# Patient Record
Sex: Female | Born: 1990 | Race: Asian | Hispanic: No | Marital: Single | Smoking: Never smoker
Health system: Southern US, Community
[De-identification: ages and names within clinical notes are randomized; demographics above are authoritative.]

## PROBLEM LIST (undated history)

## (undated) DIAGNOSIS — M549 Dorsalgia, unspecified: Secondary | ICD-10-CM

## (undated) DIAGNOSIS — G8929 Other chronic pain: Secondary | ICD-10-CM

## (undated) DIAGNOSIS — N946 Dysmenorrhea, unspecified: Secondary | ICD-10-CM

## (undated) DIAGNOSIS — J309 Allergic rhinitis, unspecified: Secondary | ICD-10-CM

---
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
## 2014-12-15 ENCOUNTER — Ambulatory Visit
Admission: EM | Admit: 2014-12-15 | Discharge: 2014-12-15 | Disposition: A | Discharge status: Home / Self Care | Payer: Managed Care, Other (non HMO) | Attending: Family Medicine | Admitting: Family Medicine

## 2014-12-15 DIAGNOSIS — B950 Streptococcus, group A, as the cause of diseases classified elsewhere: Secondary | ICD-10-CM | POA: Insufficient documentation

## 2014-12-15 DIAGNOSIS — J020 Streptococcal pharyngitis: Secondary | ICD-10-CM | POA: Insufficient documentation

## 2014-12-15 DIAGNOSIS — J029 Acute pharyngitis, unspecified: Secondary | ICD-10-CM | POA: Diagnosis present

## 2014-12-15 DIAGNOSIS — R51 Headache: Secondary | ICD-10-CM | POA: Diagnosis present

## 2014-12-15 LAB — RAPID STREP SCREEN (MED CTR MEBANE ONLY): Streptococcus, Group A Screen (Direct): POSITIVE — AB

## 2014-12-15 MED ORDER — KETOROLAC TROMETHAMINE 60 MG/2ML IM SOLN
60.0000 mg | Freq: Once | INTRAMUSCULAR | Status: AC
  Administered 2014-12-15: 60 mg via INTRAMUSCULAR

## 2014-12-15 MED ORDER — PENICILLIN G BENZATHINE 1200000 UNIT/2ML IM SUSP
1.2000 10*6.[IU] | Freq: Once | INTRAMUSCULAR | Status: AC
  Administered 2014-12-15: 1.2 10*6.[IU] via INTRAMUSCULAR

## 2014-12-15 NOTE — ED Notes (Signed)
Not feeling well the past day or so, sore throat and headache. Feeling achy.

## 2014-12-15 NOTE — ED Provider Notes (Signed)
CSN: [id number]     Arrival date & time 12/15/14  [id number] History   First MD Initiated Contact with Patient 12/15/14 1003     Chief Complaint  Patient presents with  . Sore Throat  . Headache   (Consider location/radiation/quality/duration/timing/severity/associated sxs/prior Treatment) HPI 24 yo F with 24 hours of sore throat ,fatigue, ,malaise. Felt bad yesterday but worked all day--feels worse today. Depo-provera on schedule  History reviewed. No pertinent past medical history. History reviewed. No pertinent past surgical history. History reviewed. No pertinent family history. History  Substance Use Topics  . Smoking status: Never Smoker   . Smokeless tobacco: Never Used  . Alcohol Use: Yes   OB History    No data available     Review of Systems Review of 10 systems negative for acute change except as referenced in HPI  Allergies  Review of patient's allergies indicates no known allergies.  Home Medications   Prior to Admission medications   Medication Sig Start Date End Date Taking? Authorizing Provider  medroxyPROGESTERone (DEPO-PROVERA) 150 MG/ML injection Inject 150 mg into the muscle every 3 (three) months.   Yes Historical Provider, MD   BP 115/62 mmHg  Pulse 75  Temp(Src) 98.1 F (36.7 C) (Tympanic)  Resp 20  Ht 5\' 3"  (1.6 m)  Wt 160 lb (72.576 kg)  BMI 28.35 kg/m2  SpO2 100% Physical Exam Constitutional -alert and oriented,tired appearing Head-atraumatic Eyes- conjunctiva normal, EOMI ,conjugate gaze Nose- no congestion or rhinorrhea Mouth/throat- mucous membranes moist ,oropharynx non-erythematous- tonsils enlarged, no exudate; voice deeper than normal Neck- supple with mild anterior cervical glandular enlargement and tenderness CV- regular rate, grossly normal heart sounds,  Resp-no distress, normal respiratory effort,clear to auscultation bilaterally GI- soft,non-tender,no distention GU-not examined MSK- no lower extremity tenderness nor edema,no joint  effusion, ambulatory Neuro- normal speech and language  Skin-warm,dry ,intact; no rash noted Psych-mood and affect grossly normal; speech and behavior grossly normal  ED Course  Procedures (including critical care time) Labs Review Labs Reviewed  RAPID STREP SCREEN (NOT AT [facility]) - Abnormal; Notable for the following:    Streptococcus, Group A Screen (Direct) POSITIVE (*)    All other components within normal limits    Imaging Review No results found.  Medications  penicillin g benzathine (BICILLIN LA) 1200000 UNIT/2ML injection 1.2 Million Units (1.2 Million Units Intramuscular Given 12/15/14 1023)  ketorolac (TORADOL) injection 60 mg (60 mg Intramuscular Given 12/15/14 1023)   Well tolerated by patient. MDM   1. Strep pharyngitis     Plan: 1. Test results and diagnosis reviewed with patient 2. Rx accompolished  3. Recommend supportive treatment with tylenol/ibuprofen; gargles 4. F/u prn if symptoms worsen or don't improve- may return to work tomorrow if afebrile   Rae HalstedLaurie W Deontray Hunnicutt, PA-C 12/18/14 1426

## 2015-07-31 ENCOUNTER — Ambulatory Visit
Admission: EM | Admit: 2015-07-31 | Discharge: 2015-07-31 | Disposition: A | Discharge status: Home / Self Care | Payer: Managed Care, Other (non HMO) | Attending: Family Medicine | Admitting: Family Medicine

## 2015-07-31 ENCOUNTER — Encounter: Payer: Self-pay | Admitting: Gynecology

## 2015-07-31 DIAGNOSIS — J988 Other specified respiratory disorders: Principal | ICD-10-CM

## 2015-07-31 DIAGNOSIS — B349 Viral infection, unspecified: Secondary | ICD-10-CM | POA: Diagnosis not present

## 2015-07-31 DIAGNOSIS — B9789 Other viral agents as the cause of diseases classified elsewhere: Secondary | ICD-10-CM

## 2015-07-31 HISTORY — DX: Allergic rhinitis, unspecified: J30.9

## 2015-07-31 HISTORY — DX: Dysmenorrhea, unspecified: N94.6

## 2015-07-31 HISTORY — DX: Other chronic pain: G89.29

## 2015-07-31 HISTORY — DX: Dorsalgia, unspecified: M54.9

## 2015-07-31 LAB — RAPID INFLUENZA A&B ANTIGENS
Influenza A (ARMC): NOT DETECTED
Influenza B (ARMC): NOT DETECTED

## 2015-07-31 LAB — RAPID STREP SCREEN (MED CTR MEBANE ONLY): Streptococcus, Group A Screen (Direct): NEGATIVE

## 2015-07-31 MED ORDER — OSELTAMIVIR PHOSPHATE 75 MG PO CAPS: 75.0000 mg | ORAL_CAPSULE | Freq: Two times a day (BID) | ORAL | Status: DC

## 2015-07-31 NOTE — ED Provider Notes (Signed)
[facility]  ____________________________________________  Time seen: Approximately 8:59 PM  I have reviewed the triage vital signs and the nursing notes.   HISTORY  Chief Complaint Sore Throat  HPI Casey Turner is a 25 y.o. female presents for the complaint of one day of runny nose, sore throat, cough. Reports also accompanying body aches. Denies known fever. Reports has continued to eat and drink well. 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 Reports is taken over-the-counter Sudafed with minimal improvement.  Denies chest pain, shortness of breath, abdominal pain, dizziness, weakness, dysuria, rash, neck or back pain.   Past Medical History  Diagnosis Date  . Chronic back pain   . Dysmenorrhea   . Allergic rhinitis    denies chance of pregnancy  There are no active problems to display for this patient.   History reviewed. No pertinent past surgical history.  Current Outpatient Rx  Name  Route  Sig  Dispense  Refill  . medroxyPROGESTERone (DEPO-PROVERA) 150 MG/ML injection   Intramuscular   Inject 150 mg into the muscle every 3 (three) months.          Allergies Review of patient's allergies indicates no known allergies.  No family history on file.  Social History Social History  Substance Use Topics  . Smoking status: Never Smoker   . Smokeless tobacco: Never Used  . Alcohol Use: Yes    Review of Systems Constitutional: No fever/chills Eyes: No visual changes. ENT: Positive runny nose, nasal congestion, sore throat and cough. Cardiovascular: Denies chest pain. Respiratory: Denies shortness of breath. Gastrointestinal: No abdominal pain.  No nausea, no vomiting.  No diarrhea.  No constipation. Genitourinary: Negative for dysuria. Musculoskeletal: Negative for back pain. Skin: Negative for rash. Neurological: Negative for headaches, focal weakness or  numbness.  10-point ROS otherwise negative.  ____________________________________________   PHYSICAL EXAM:  VITAL SIGNS: ED Triage Vitals  Enc Vitals Group     BP 07/31/15 1920 109/63 mmHg     Pulse Rate 07/31/15 1920 73     Resp 07/31/15 1920 16     Temp 07/31/15 1920 98.7 F (37.1 C)     Temp Source 07/31/15 1920 Oral     SpO2 07/31/15 1920 100 %     Weight 07/31/15 1920 174 lb (78.926 kg)     Height 07/31/15 1920  (1.6 m)     Head Cir --      Peak Flow --      Pain Score 07/31/15 1923 5     Pain Loc --      Pain Edu? --      Excl. in GC? --     Constitutional: Alert and oriented. Well appearing and in no acute distress. Eyes: Conjunctivae are normal. PERRL. EOMI. Head: Atraumatic. No sinus tenderness to palpation. No swelling. No erythema.  Ears: no erythema, normal TMs bilaterally.   Nose: Nasal congestion with clear rhinorrhea.  Mouth/Throat: Mucous membranes are moist.  Mild pharyngeal erythema. No tonsillar swelling or exudate. Neck: No stridor.  No cervical spine tenderness to palpation. Hematological/Lymphatic/Immunilogical: No cervical lymphadenopathy. Cardiovascular: Normal rate, regular rhythm. Grossly normal heart sounds.  Good peripheral circulation. Respiratory: Normal respiratory effort.  No retractions. Lungs CTAB. No wheezes, rales or rhonchi. Gastrointestinal: Soft and nontender. No distention. Normal Bowel sounds.  Musculoskeletal: No lower or upper extremity tenderness nor edema.  Neurologic:  Normal speech and language. No gross  focal neurologic deficits are appreciated. No gait instability. Skin:  Skin is warm, dry and intact. No rash noted. Psychiatric: Mood and affect are normal. Speech and behavior are normal.  ____________________________________________   LABS (all labs ordered are listed, but only abnormal results are displayed)  Labs Reviewed  RAPID STREP SCREEN (NOT AT [facility])  RAPID INFLUENZA A&B ANTIGENS ([facility] ONLY)  CULTURE,  GROUP A STREP [facility])     INITIAL IMPRESSION / ASSESSMENT AND PLAN / ED COURSE  Pertinent labs & imaging results that were available during my care of the patient were reviewed by me and considered in my medical decision making (see chart for details).  Very well-appearing patient. No acute distress. Presents for the complaints of 1 day history of runny nose, nasal congestion, sore throat and cough. Denies known fevers. Positive exposure to the flu just prior to symptom onset. Quick strep negative, will culture. Influenza test negative. However suspect viral infection such as influenza and with positive exposure just prior to symptom onset will treat with oral Tamiflu. Will also treat with oral Claritin-D over-the-counter as needed. Encouraged rest, fluids, over-the-counter Tylenol or ibuprofen as needed. School note given for tomorrow. Encourage PCP follow up as needed.  Discussed follow up with Primary care physician this week. Discussed follow up and return parameters including no resolution or any worsening concerns. Patient verbalized understanding and agreed to plan.   ____________________________________________   FINAL CLINICAL IMPRESSION(S) / ED DIAGNOSES  Final diagnoses:  Viral respiratory illness      Note: This dictation was prepared with Dragon dictation along with smaller phrase technology. Any transcriptional errors that result from this process are unintentional.    [name], NP 07/31/15 2112

## 2015-07-31 NOTE — ED Notes (Signed)
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

## 2015-07-31 NOTE — Discharge Instructions (Signed)
Take medication as prescribed. Rest. Drink plenty of fluids.  ° °Follow up with your primary care physician this week as needed. Return to Urgent care for new or worsening concerns.  ° ° °Viral Infections °A viral infection can be caused by different types of viruses. Most viral infections are not serious and resolve on their own. However, some infections may cause severe symptoms and may lead to further complications. °SYMPTOMS °Viruses can frequently cause: °· Minor sore throat. °· Aches and pains. °· Headaches. °· Runny nose. °· Different types of rashes. °· Watery eyes. °· Tiredness. °· Cough. °· Loss of appetite. °· Gastrointestinal infections, resulting in nausea, vomiting, and diarrhea. °These symptoms do not respond to antibiotics because the infection is not caused by bacteria. However, you might catch a bacterial infection following the viral infection. This is sometimes called a "superinfection." Symptoms of such a bacterial infection may include: °· Worsening sore throat with pus and difficulty swallowing. °· Swollen neck glands. °· Chills and a high or persistent fever. °· Severe headache. °· Tenderness over the sinuses. °· Persistent overall ill feeling (malaise), muscle aches, and tiredness (fatigue). °· Persistent cough. °· Yellow, green, or brown mucus production with coughing. °HOME CARE INSTRUCTIONS  °· Only take over-the-counter or prescription medicines for pain, discomfort, diarrhea, or fever as directed by your caregiver. °· Drink enough water and fluids to keep your urine clear or pale yellow. Sports drinks can provide valuable electrolytes, sugars, and hydration. °· Get plenty of rest and maintain proper nutrition. Soups and broths with crackers or rice are fine. °SEEK IMMEDIATE MEDICAL CARE IF:  °· You have severe headaches, shortness of breath, chest pain, neck pain, or an unusual rash. °· You have uncontrolled vomiting, diarrhea, or you are unable to keep down fluids. °· You or your child  has an oral temperature above 102° F (38.9° C), not controlled by medicine. °· Your baby is older than 3 months with a rectal temperature of 102° F (38.9° C) or higher. °· Your baby is 3 months old or younger with a rectal temperature of 100.4° F (38° C) or higher. °MAKE SURE YOU:  °· Understand these instructions. °· Will watch your condition. °· Will get help right away if you are not doing well or get worse. °  °This information is not intended to replace advice given to you by your health care provider. Make sure you discuss any questions you have with your health care provider. °  °Document Released: 03/10/2005 Document Revised: 08/23/2011 Document Reviewed: 11/06/2014 °Elsevier Interactive Patient Education ©2016 [employer] Inc[employer] ° °

## 2015-08-02 LAB — CULTURE, GROUP A STREP (THRC)

## 2018-01-13 ENCOUNTER — Other Ambulatory Visit: Payer: Self-pay

## 2018-01-13 ENCOUNTER — Encounter: Payer: Self-pay | Admitting: Emergency Medicine

## 2018-01-13 ENCOUNTER — Ambulatory Visit
Admission: EM | Admit: 2018-01-13 | Discharge: 2018-01-13 | Disposition: A | Discharge status: Home / Self Care | Payer: Commercial Managed Care - PPO | Attending: Family Medicine | Admitting: Family Medicine

## 2018-01-13 DIAGNOSIS — L501 Idiopathic urticaria: Secondary | ICD-10-CM

## 2018-01-13 NOTE — ED Provider Notes (Signed)
[facility]    CSN: [id number] Arrival date & time: 01/13/18  0813     History   Chief Complaint Chief Complaint  Patient presents with  . Urticaria    HPI Casey Turner is a 27 y.o. female.   27 yo female with a c/o hives since yesterday. States she took 2 benadryl last night. Denies any shortness of breath, swelling, wheezing, chest pains, fevers, chills.  No known trigger. Denies any recent illness, new medications, new soaps or detergents.   The history is provided by the patient.    Past Medical History:  Diagnosis Date  . Allergic rhinitis   . Chronic back pain   . Dysmenorrhea     There are no active problems to display for this patient.   History reviewed. No pertinent surgical history.  OB History   None      Home Medications    Prior to Admission medications   Medication Sig Start Date End Date Taking? Authorizing Provider  medroxyPROGESTERone (DEPO-PROVERA) 150 MG/ML injection Inject 150 mg into the muscle every 3 (three) months.   Yes [provider]  oseltamivir (TAMIFLU) 75 MG capsule Take 1 capsule (75 mg total) by mouth every 12 (twelve) hours. 07/31/15   Renford DillsMiller, Lindsey, NP    Family History Family History  Problem Relation Age of Onset  . Healthy Mother   . Healthy Father     Social History Social History   Tobacco Use  . Smoking status: Never Smoker  . Smokeless tobacco: Never Used  Substance Use Topics  . Alcohol use: Yes  . Drug use: No     Allergies   Patient has no known allergies.   Review of Systems Review of Systems   Physical Exam Triage Vital Signs ED Triage Vitals  Enc Vitals Group     BP 01/13/18 0826 120/78     Pulse Rate 01/13/18 0826 74     Resp 01/13/18 0826 16     Temp 01/13/18 0826 98.6 F (37 C)     Temp Source 01/13/18 0826 Oral     SpO2 01/13/18 0826 100 %     Weight 01/13/18 0824 187 lb (84.8 kg)     Height 01/13/18 0824 5\' 3"  (1.6 m)     Head Circumference --    Peak Flow --      Pain Score 01/13/18 0824 0     Pain Loc --      Pain Edu? --      Excl. in GC? --    No data found.  Updated Vital Signs BP 120/78 (BP Location: Left Arm)   Pulse 74   Temp 98.6 F (37 C) (Oral)   Resp 16   Ht 5\' 3"  (1.6 m)   Wt 187 lb (84.8 kg)   SpO2 100%   BMI 33.13 kg/m   Visual Acuity Right Eye Distance:   Left Eye Distance:   Bilateral Distance:    Right Eye Near:   Left Eye Near:    Bilateral Near:     Physical Exam  Constitutional: She appears well-developed and well-nourished. No distress.  HENT:  Mouth/Throat: Uvula is midline. No uvula swelling. No oropharyngeal exudate, posterior oropharyngeal edema, posterior oropharyngeal erythema or tonsillar abscesses. No tonsillar exudate.  Cardiovascular: Normal rate, regular rhythm and normal heart sounds.  Pulmonary/Chest: Effort normal and breath sounds normal. No stridor. No respiratory distress. She has no wheezes. She has no rales.  Skin: Rash noted. Rash is  urticarial (diffuse over extremities and trunk). She is not diaphoretic.  Nursing note and vitals reviewed.    UC Treatments / Results  Labs (all labs ordered are listed, but only abnormal results are displayed) Labs Reviewed - No data to display  EKG None  Radiology No results found.  Procedures Procedures (including critical care time)  Medications Ordered in UC Medications - No data to display  Initial Impression / Assessment and Plan / UC Course  I have reviewed the triage vital signs and the nursing notes.  Pertinent labs & imaging results that were available during my care of the patient were reviewed by me and considered in my medical decision making (see chart for details).      Final Clinical Impressions(s) / UC Diagnoses   Final diagnoses:  Idiopathic urticaria     Discharge Instructions     Zyrtec, claritin or allegra once daily Zantac twice  Benadryl as needed    ED Prescriptions    None      1. diagnosis reviewed with patient 2. Recommend supportive treatment as above 3. Follow-up prn if symptoms worsen or don't improve   Controlled Substance Prescriptions [place] Controlled Substance Registry consulted? Not Applicable   Payton Mccallum, MD 01/13/18 [phone]

## 2018-01-13 NOTE — ED Triage Notes (Signed)
Patient c/o hives that started last night. Stated she took 2 Benadryl with no relief.

## 2018-01-13 NOTE — Discharge Instructions (Addendum)
Zyrtec, claritin or allegra once daily Zantac twice  Benadryl as needed

## 2018-05-14 ENCOUNTER — Ambulatory Visit (INDEPENDENT_AMBULATORY_CARE_PROVIDER_SITE_OTHER): Payer: Commercial Managed Care - PPO

## 2018-05-14 ENCOUNTER — Encounter: Payer: Self-pay | Admitting: Gynecology

## 2018-05-14 ENCOUNTER — Ambulatory Visit
Admission: EM | Admit: 2018-05-14 | Discharge: 2018-05-14 | Disposition: A | Discharge status: Home / Self Care | Payer: Commercial Managed Care - PPO | Attending: Emergency Medicine | Admitting: Emergency Medicine

## 2018-05-14 ENCOUNTER — Other Ambulatory Visit: Payer: Self-pay

## 2018-05-14 DIAGNOSIS — J329 Chronic sinusitis, unspecified: Secondary | ICD-10-CM | POA: Insufficient documentation

## 2018-05-14 DIAGNOSIS — R079 Chest pain, unspecified: Secondary | ICD-10-CM

## 2018-05-14 DIAGNOSIS — G8929 Other chronic pain: Secondary | ICD-10-CM | POA: Insufficient documentation

## 2018-05-14 DIAGNOSIS — R059 Cough, unspecified: Secondary | ICD-10-CM

## 2018-05-14 DIAGNOSIS — R0789 Other chest pain: Secondary | ICD-10-CM | POA: Diagnosis not present

## 2018-05-14 DIAGNOSIS — R0982 Postnasal drip: Secondary | ICD-10-CM | POA: Insufficient documentation

## 2018-05-14 DIAGNOSIS — Z793 Long term (current) use of hormonal contraceptives: Secondary | ICD-10-CM | POA: Insufficient documentation

## 2018-05-14 DIAGNOSIS — R05 Cough: Secondary | ICD-10-CM

## 2018-05-14 DIAGNOSIS — J019 Acute sinusitis, unspecified: Secondary | ICD-10-CM | POA: Diagnosis not present

## 2018-05-14 LAB — PREGNANCY, URINE: Preg Test, Ur: NEGATIVE

## 2018-05-14 LAB — TROPONIN I: Troponin I: <0.03 ng/mL (ref <0.03)

## 2018-05-14 MED ORDER — FAMOTIDINE 20 MG PO TABS: 20.0000 mg | ORAL_TABLET | Freq: Two times a day (BID) | ORAL | 0 refills | Status: AC

## 2018-05-14 MED ORDER — HYDROCOD POLST-CPM POLST ER 10-8 MG/5ML PO SUER: 5.0000 mL | Freq: Two times a day (BID) | ORAL | 0 refills | Status: AC | PRN

## 2018-05-14 MED ORDER — FLUTICASONE PROPIONATE 50 MCG/ACT NA SUSP: 2.0000 | Freq: Every day | NASAL | 0 refills | Status: AC

## 2018-05-14 MED ORDER — DOXYCYCLINE HYCLATE 100 MG PO CAPS: 100.0000 mg | ORAL_CAPSULE | Freq: Two times a day (BID) | ORAL | 0 refills | Status: AC

## 2018-05-14 MED ORDER — BENZONATATE 200 MG PO CAPS: 200.0000 mg | ORAL_CAPSULE | Freq: Three times a day (TID) | ORAL | 0 refills | Status: AC | PRN

## 2018-05-14 NOTE — ED Triage Notes (Signed)
Patient c/o cough x 4 weeks.

## 2018-05-14 NOTE — ED Provider Notes (Signed)
HPI  SUBJECTIVE:  Casey Turner is a 27 y.o. female who presents with 4 weeks of a nonproductive cough.  States that she is unable to sleep at night secondary to the cough.  She does report some allergy symptoms but states that they have not responded to Claritin or Allegra.  She denies fevers, wheezing, shortness of breath, dyspnea on exertion, GERD symptoms.  She denies preceding URI symptoms.  No unintentional weight loss.  She has tried Robitussin, Claritin, Allegra, Sudafed, TheraFlu, DayQuil, NyQuil.  The TheraFlu helps.  Symptoms are worse with talking, and " being hot".  She reports yellowish nasal congestion for 2 weeks, postnasal drip, sinus pain and pressure for the past week.  No upper dental pain.    She does report an episode of substernal constant chest pain described as pressure lasting hours 3 days ago.  No accompanying nausea, diaphoresis.  There was no exertional or positional component to this.  There were no aggravating or alleviating factors.  It has since resolved and has not recurred.  This has happened before but she has never sought medical treatment for this.  She denies belching, water brash.  She has a past medical history of allergies, sinusitis.  No history of asthma, eczema, COPD, smoking, GERD, hypertension, MI, hypercholesterolemia, diabetes, PE, DVT.  Family history negative for early MI.  LMP: October 25.  She is not sure if she could be pregnant.  PMD: Albina Billet, CNM    Past Medical History:  Diagnosis Date  . Allergic rhinitis   . Chronic back pain   . Dysmenorrhea     History reviewed. No pertinent surgical history.  Family History  Problem Relation Age of Onset  . Healthy Mother   . Healthy Father     Social History   Tobacco Use  . Smoking status: Never Smoker  . Smokeless tobacco: Never Used  Substance Use Topics  . Alcohol use: Yes  . Drug use: No    No current facility-administered medications for this encounter.   Current  Outpatient Medications:  .  medroxyPROGESTERone (DEPO-PROVERA) 150 MG/ML injection, Inject 150 mg into the muscle every 3 (three) months., Disp: , Rfl:  .  benzonatate (TESSALON) 200 MG capsule, Take 1 capsule (200 mg total) by mouth 3 (three) times daily as needed for cough., Disp: 30 capsule, Rfl: 0 .  chlorpheniramine-HYDROcodone (TUSSIONEX PENNKINETIC ER) 10-8 MG/5ML SUER, Take 5 mLs by mouth every 12 (twelve) hours as needed for cough., Disp: 60 mL, Rfl: 0 .  doxycycline (VIBRAMYCIN) 100 MG capsule, Take 1 capsule (100 mg total) by mouth 2 (two) times daily for 7 days., Disp: 14 capsule, Rfl: 0 .  famotidine (PEPCID) 20 MG tablet, Take 1 tablet (20 mg total) by mouth 2 (two) times daily., Disp: 40 tablet, Rfl: 0 .  fluticasone (FLONASE) 50 MCG/ACT nasal spray, Place 2 sprays into both nostrils daily., Disp: 16 g, Rfl: 0  No Known Allergies   ROS  As noted in HPI.   Physical Exam  BP 133/87 (BP Location: Left Arm)   Pulse 77   Temp 98.7 F (37.1 C) (Oral)   Resp 16   Ht 5\' 3"  (1.6 m)   Wt 88 kg   SpO2 99%   BMI 34.37 kg/m   Constitutional: Well developed, well nourished, no acute distress.  Coughing. Eyes:  EOMI, conjunctiva normal bilaterally HENT: Normocephalic, atraumatic,mucus membranes moist.  Positive nasal congestion.  Erythematous, swollen turbinates.  No maxillary frontal sinus tenderness.  Positive cobblestoning posterior oropharynx.  No obvious postnasal drip. Respiratory: Normal inspiratory effort lungs clear bilaterally, good air movement.  No chest wall tenderness Cardiovascular: Normal rate, regular rhythm, no murmurs, rubs, gallops. GI: nondistended skin: No rash, skin intact Musculoskeletal: no deformities Neurologic: Alert & oriented x 3, no focal neuro deficits Psychiatric: Speech and behavior appropriate   ED Course   Medications - No data to display  Orders Placed This Encounter  Procedures  . DG Chest 2 View    Standing Status:   Standing     Number of Occurrences:   1    Order Specific Question:   Reason for Exam (SYMPTOM  OR DIAGNOSIS REQUIRED)    Answer:   cough x 4 weeks  . Troponin I - Once    Standing Status:   Standing    Number of Occurrences:   1  . Pregnancy, urine    Standing Status:   Standing    Number of Occurrences:   1  . ED EKG    Standing Status:   Standing    Number of Occurrences:   1    Order Specific Question:   Reason for Exam    Answer:   Chest Pain  . EKG 12-Lead    Standing Status:   Standing    Number of Occurrences:   1    Results for orders placed or performed during the hospital encounter of 05/14/18 (from the past 24 hour(s))  Troponin I - Once     Status: None   Collection Time: 05/14/18  2:30 PM  Result Value Ref Range   Troponin I <0.03 <0.03 ng/mL  Pregnancy, urine     Status: None   Collection Time: 05/14/18  2:30 PM  Result Value Ref Range   Preg Test, Ur NEGATIVE NEGATIVE   Dg Chest 2 View  Result Date: 05/14/2018 CLINICAL DATA:  Cough and congestion for 4 weeks. EXAM: CHEST - 2 VIEW COMPARISON:  None. FINDINGS: The cardiomediastinal silhouette is within normal limits. The lungs are well inflated and clear. No pleural effusion or pneumothorax is identified. No acute osseous abnormality is seen. IMPRESSION: No active cardiopulmonary disease. Electronically Signed   By: Sebastian AcheAllen  Grady M.D.   On: 05/14/2018 14:48    ED Clinical Impression  Cough  Acute non-recurrent sinusitis, unspecified location  Chest pain, unspecified type   ED Assessment/Plan  Checking chest x-ray due to duration of symptoms.    [place] Narcotic database reviewed for this patient, and feel that the risk/benefit ratio today is favorable for proceeding with a prescription for controlled substance.  No opiate prescriptions in 2 years.  Reviewed imaging independently.  Normal chest x-ray.  See radiology report for full details.  Pregnancy negative.  As for the chest pain, will check an EKG and a single  troponin.  EKG: Normal sinus rhythm, rate 72.  Normal axis, normal intervals.  No hypertrophy.  No previous EKG for comparison.  1.  Cough: Suspect postnasal drip or GERD causing the cough.  She has already tried Insurance underwriterClaritin and Allegra, will have her try Zyrtec, Flonase, will also start her on some Pepcid.  Tessalon for the cough during the day Tussionex for the cough at night.    2. Sinusitis-wait-and-see prescription of doxycycline.  Saline nasal irrigation, Flonase.   3.  Chest pain.  EKG normal, troponin negative.  Chest x-ray negative.  Suspect that this was GERD.  Pepcid should help with this.  Follow up with PMD as needed.  To the ER for chest pain returns, does not resolve, changes, for radiation up her neck, down her arm, through to her back, nausea, diaphoresis, or if it gets worse with exertion.  Discussed labs, imaging, MDM, treatment plan, and plan for follow-up with patient. Discussed sn/sx that should prompt return to the ED. patient agrees with plan.   Meds ordered this encounter  Medications  . benzonatate (TESSALON) 200 MG capsule    Sig: Take 1 capsule (200 mg total) by mouth 3 (three) times daily as needed for cough.    Dispense:  30 capsule    Refill:  0  . chlorpheniramine-HYDROcodone (TUSSIONEX PENNKINETIC ER) 10-8 MG/5ML SUER    Sig: Take 5 mLs by mouth every 12 (twelve) hours as needed for cough.    Dispense:  60 mL    Refill:  0  . fluticasone (FLONASE) 50 MCG/ACT nasal spray    Sig: Place 2 sprays into both nostrils daily.    Dispense:  16 g    Refill:  0  . doxycycline (VIBRAMYCIN) 100 MG capsule    Sig: Take 1 capsule (100 mg total) by mouth 2 (two) times daily for 7 days.    Dispense:  14 capsule    Refill:  0  . famotidine (PEPCID) 20 MG tablet    Sig: Take 1 tablet (20 mg total) by mouth 2 (two) times daily.    Dispense:  40 tablet    Refill:  0    *This clinic note was created using Scientist, clinical (histocompatibility and immunogenetics). Therefore, there may be occasional  mistakes despite careful proofreading.   ?    Domenick Gong, MD 05/14/18 [phone]

## 2018-05-14 NOTE — Discharge Instructions (Addendum)
Cough: Tessalon for the cough during the day, Tussionex for the cough at night.  Start some Zyrtec.  Pepcid will help with any possible acid reflux.  Do some saline nasal irrigation with a Lloyd HugerNeil med sinus rinse, distilled water as often as you want.  Try the Flonase as well.  Sinusitis: Wait-and-see prescription of doxycycline-wait several days before filling this to see if your nasal congestion gets better with the Flonase, saline nasal irrigation  Chest pain: It does not appear to be a problem with your heart today.  I suspect that this was acid reflux.  The Pepcid should help with this.

## 2019-06-21 ENCOUNTER — Ambulatory Visit: Payer: BC Managed Care – PPO | Attending: Internal Medicine

## 2019-06-21 DIAGNOSIS — Z20822 Contact with and (suspected) exposure to covid-19: Secondary | ICD-10-CM

## 2019-06-23 LAB — NOVEL CORONAVIRUS, NAA: SARS-CoV-2, NAA: NOT DETECTED

## 2019-07-26 ENCOUNTER — Other Ambulatory Visit: Payer: BC Managed Care – PPO

## 2019-07-26 ENCOUNTER — Ambulatory Visit: Payer: BC Managed Care – PPO | Attending: Internal Medicine

## 2019-07-26 DIAGNOSIS — Z20822 Contact with and (suspected) exposure to covid-19: Secondary | ICD-10-CM

## 2019-07-27 LAB — NOVEL CORONAVIRUS, NAA: SARS-CoV-2, NAA: NOT DETECTED

## 2019-08-14 IMAGING — CR DG CHEST 2V
2 series · 2 of 2 positions shown · non-contrast
Comparison: None.

CLINICAL DATA: Cough and congestion for 4 weeks.

EXAM:
CHEST - 2 VIEW

[chest pa]
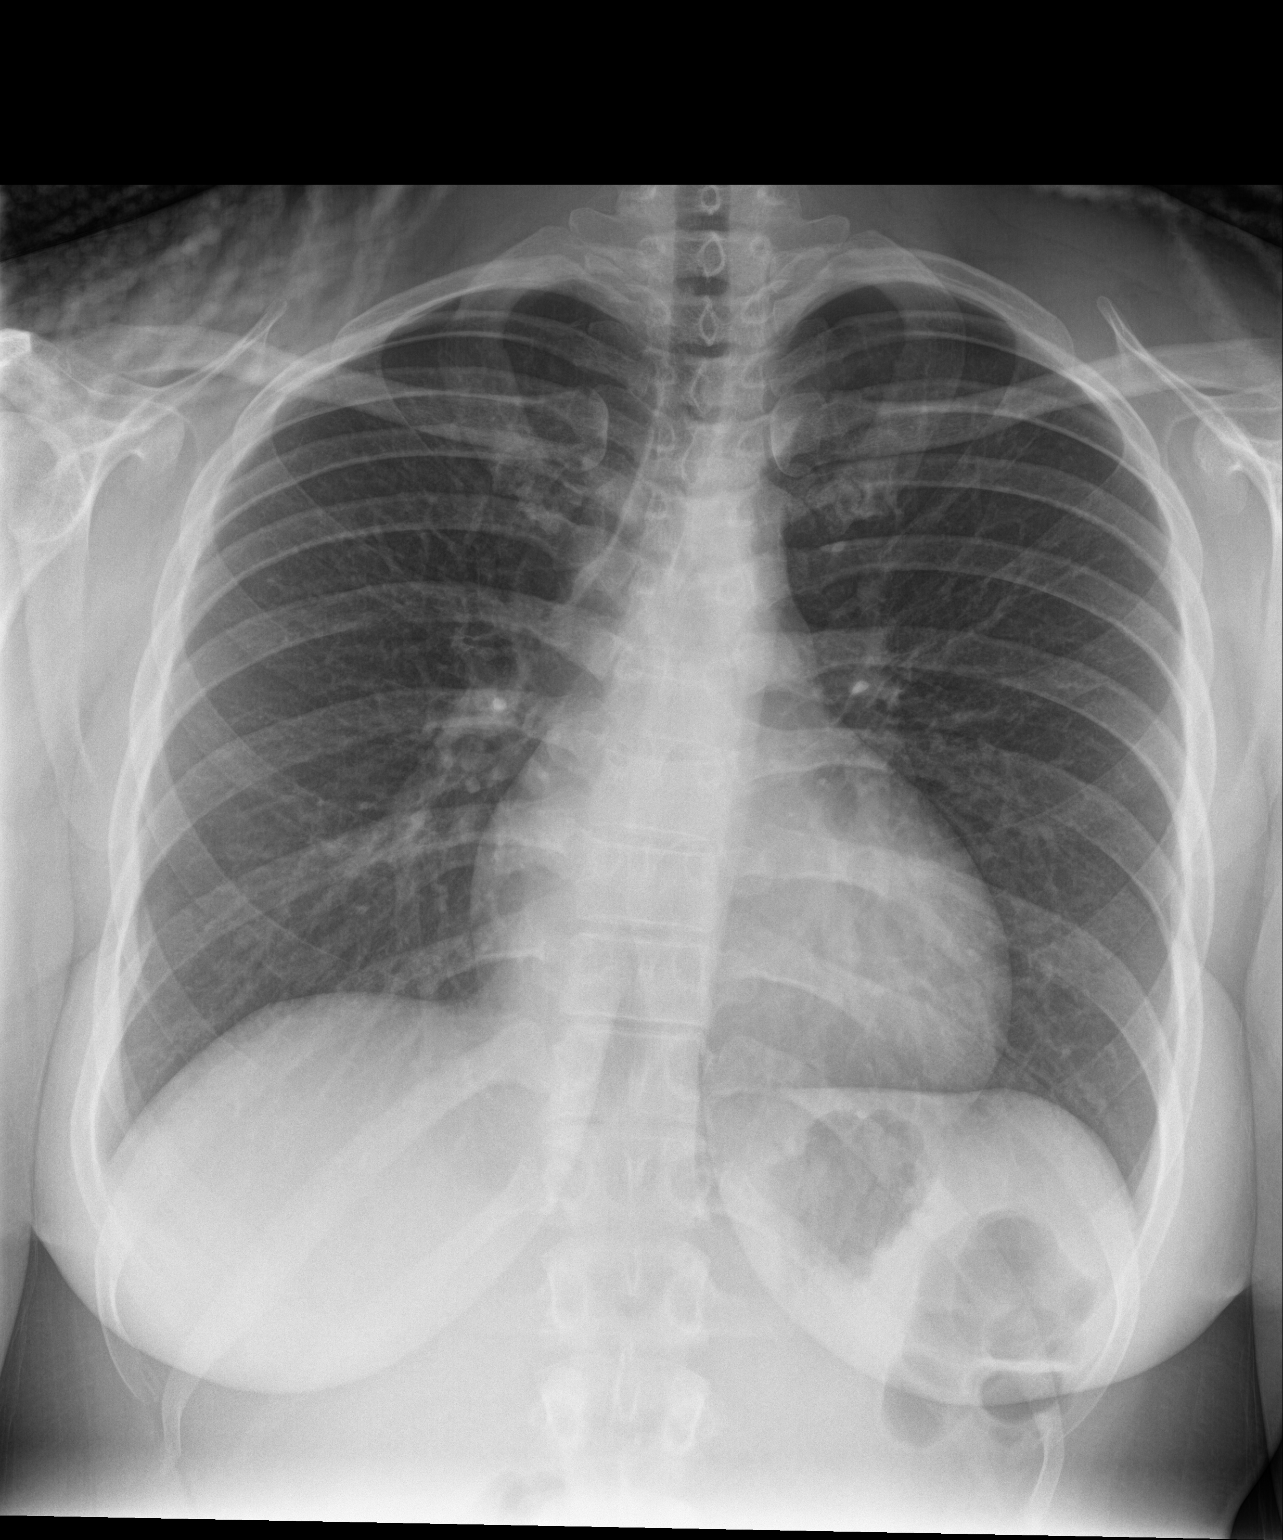

[chest lat]
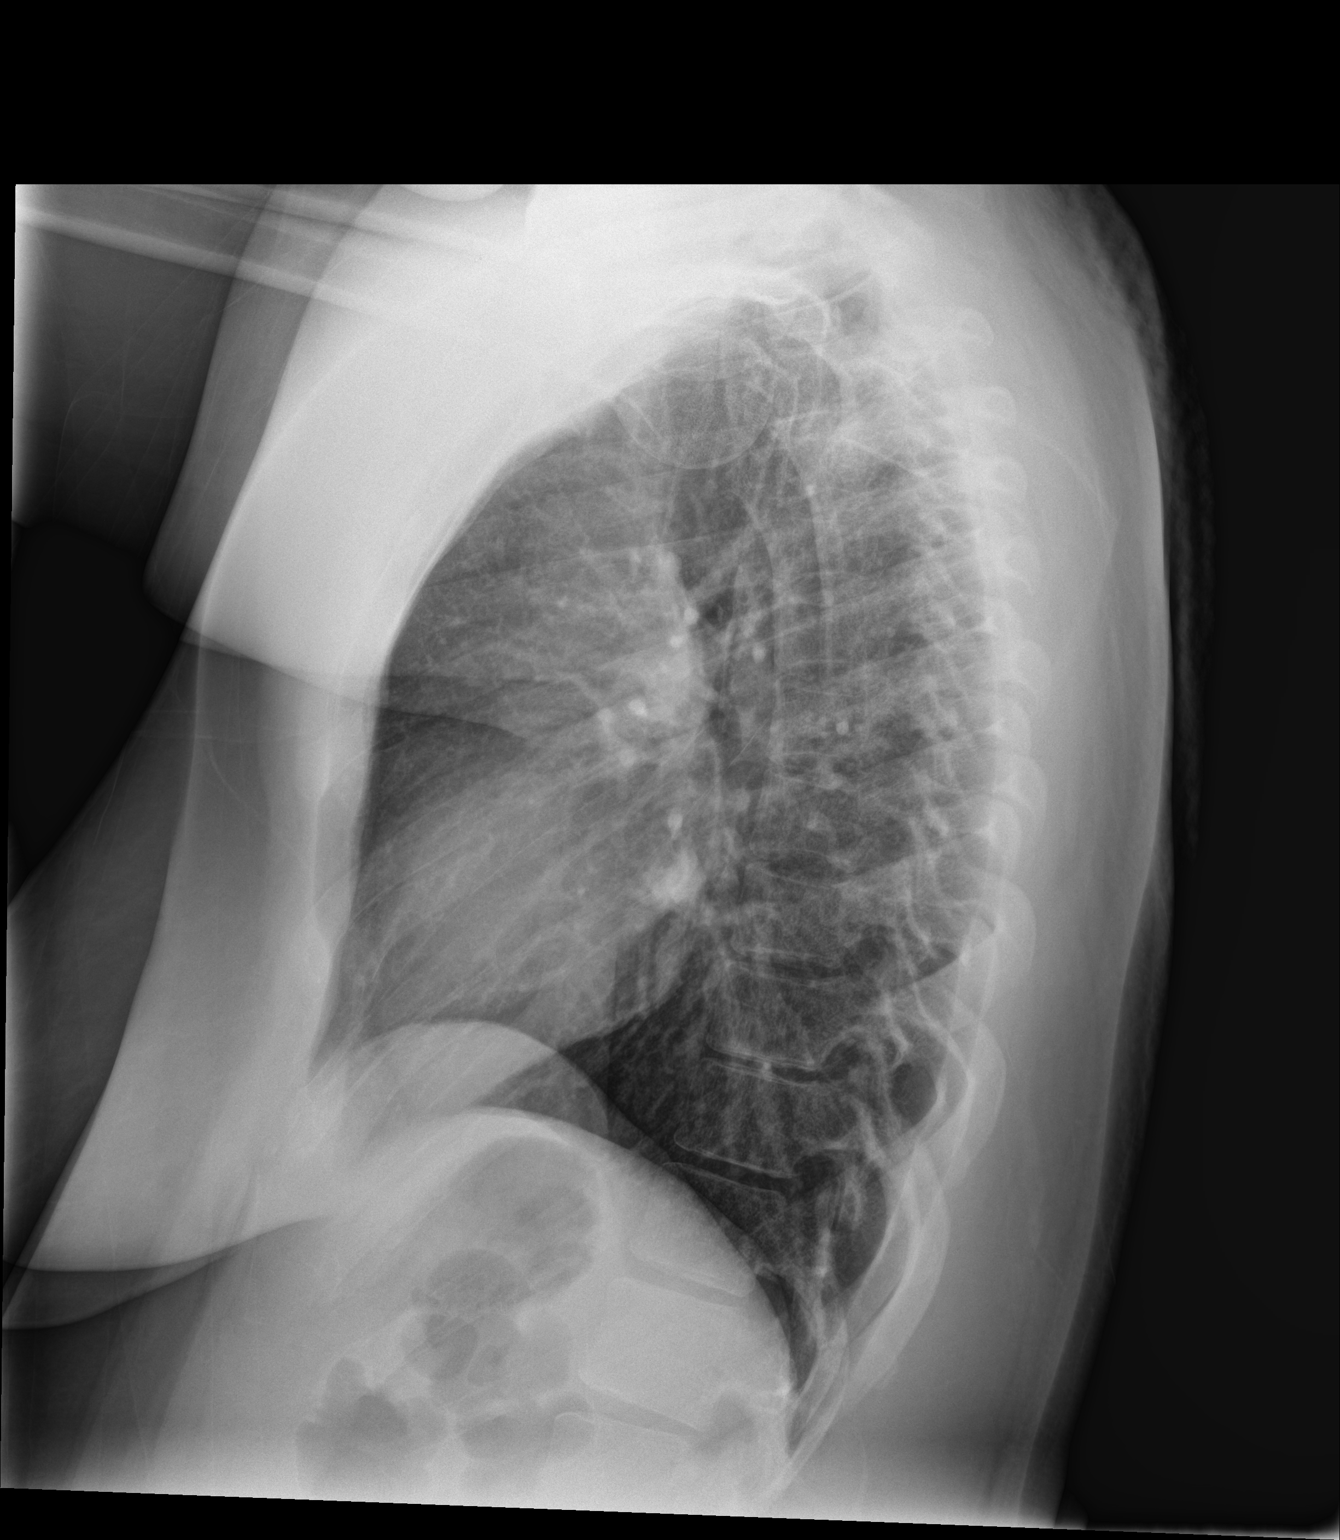

[2 of 2 positions shown; findings below may reference images not displayed]

FINDINGS: The cardiomediastinal silhouette is within normal limits. The lungs
are well inflated and clear. No pleural effusion or pneumothorax is
identified. No acute osseous abnormality is seen.
IMPRESSION: No active cardiopulmonary disease.

## 2021-06-01 ENCOUNTER — Encounter: Payer: Self-pay | Admitting: Emergency Medicine

## 2021-06-01 ENCOUNTER — Ambulatory Visit
Admission: EM | Admit: 2021-06-01 | Discharge: 2021-06-01 | Disposition: A | Discharge status: Home / Self Care | Payer: BC Managed Care – PPO | Attending: Family Medicine | Admitting: Family Medicine

## 2021-06-01 ENCOUNTER — Other Ambulatory Visit: Payer: Self-pay

## 2021-06-01 DIAGNOSIS — J019 Acute sinusitis, unspecified: Secondary | ICD-10-CM | POA: Diagnosis not present

## 2021-06-01 LAB — POCT INFLUENZA A/B
Influenza A, POC: NEGATIVE
Influenza B, POC: NEGATIVE

## 2021-06-01 MED ORDER — PREDNISONE 10 MG PO TABS
10.0000 mg | ORAL_TABLET | Freq: Every day | ORAL | 0 refills | Status: AC
Start: 2021-06-01 — End: 2021-06-06

## 2021-06-01 MED ORDER — AZITHROMYCIN 250 MG PO TABS: ORAL_TABLET | ORAL | 0 refills | Status: DC

## 2021-06-01 NOTE — ED Provider Notes (Signed)
[name]    CSN: [id number] Arrival date & time: 06/01/21  7672      History   Chief Complaint Chief Complaint  Patient presents with   Nasal Congestion   Headache    HPI Casey Turner is a 30 y.o. female.   HPI Patient presents today with a 5-day history of sinus pressure, congestion, headache x5 days.  Patient has been negative for fever.  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 She not experiencing any cough or sore throat. She has a history of chronic sinusitis and take Cetirizine D routinely . Since being sick she has added Mucinex and Sudafed and achieved no relief of symptoms.  Past Medical History:  Diagnosis Date   Allergic rhinitis    Chronic back pain    Dysmenorrhea     There are no problems to display for this patient.   History reviewed. No pertinent surgical history.  OB History   No obstetric history on file.      Home Medications    Prior to Admission medications   Medication Sig Start Date End Date Taking? Authorizing Provider  azithromycin (ZITHROMAX) 250 MG tablet Take 2 tabs PO x 1 dose, then 1 tab PO QD x 4 days 06/01/21  Yes Bing Neighbors, FNP  benzonatate (TESSALON) 200 MG capsule Take 1 capsule (200 mg total) by mouth 3 (three) times daily as needed for cough. 05/14/18   Domenick Gong, MD  chlorpheniramine-HYDROcodone (TUSSIONEX PENNKINETIC ER) 10-8 MG/5ML SUER Take 5 mLs by mouth every 12 (twelve) hours as needed for cough. 05/14/18   Domenick Gong, MD  famotidine (PEPCID) 20 MG tablet Take 1 tablet (20 mg total) by mouth 2 (two) times daily. 05/14/18   Domenick Gong, MD  fluticasone (FLONASE) 50 MCG/ACT nasal spray Place 2 sprays into both nostrils daily. 05/14/18   Domenick Gong, MD  medroxyPROGESTERone (DEPO-PROVERA) 150 MG/ML injection Inject 150 mg into the muscle every 3 (three) months.    [provider]   predniSONE (DELTASONE) 10 MG tablet Take 1 tablet (10 mg total) by mouth daily with breakfast for 5 days. 06/01/21 06/06/21 Yes Bing Neighbors, FNP    Family History Family History  Problem Relation Age of Onset   Healthy Mother    Healthy Father     Social History Social History   Tobacco Use   Smoking status: Never   Smokeless tobacco: Never  Substance Use Topics   Alcohol use: Yes   Drug use: No     Allergies   Metronidazole   Review of Systems Review of Systems Pertinent negatives listed in HPI   Physical Exam Triage Vital Signs ED Triage Vitals  Enc Vitals Group     BP 06/01/21 0849 (!) 143/86     Pulse Rate 06/01/21 0849 89     Resp 06/01/21 0849 20     Temp 06/01/21 0849 97.9 F (36.6 C)     Temp src --      SpO2 06/01/21 0849 100 %     Weight --      Height --      Head Circumference --      Peak Flow --      Pain Score 06/01/21 0850 4     Pain Loc --      Pain Edu? --      Excl. in GC? --  No data found.  Updated Vital Signs BP (!) 143/86    Pulse 89    Temp 97.9 F (36.6 C)    Resp 20    SpO2 100%   Visual Acuity Right Eye Distance:   Left Eye Distance:   Bilateral Distance:    Right Eye Near:   Left Eye Near:    Bilateral Near:     Physical Exam   General Appearance:    Alert, cooperative, no distress  HENT:   Normocephalic, ears normal, nares mucosal edema with congestion, rhinorrhea, oropharynx  patent   Eyes:    PERRL, conjunctiva/corneas clear, EOM's intact       Lungs:     Clear to auscultation bilaterally, respirations unlabored  Heart:    Regular rate and rhythm  Neurologic:   Awake, alert, oriented x 3. No apparent focal neurological           defect.     UC Treatments / Results  Labs (all labs ordered are listed, but only abnormal results are displayed) Labs Reviewed  POCT INFLUENZA A/B - Normal    EKG   Radiology No results found.  Procedures Procedures (including critical care time)  Medications  Ordered in UC Medications - No data to display  Initial Impression / Assessment and Plan / UC Course  I have reviewed the triage vital signs and the nursing notes.  Pertinent labs & imaging results that were available during my care of the patient were reviewed by me and considered in my medical decision making (see chart for details).    Negative influenza. Treating for an acute sinus infection. Treatment per discharge medication orders. Force fluids. Continue daily allergy medication regimen. RTC PRN Final Clinical Impressions(s) / UC Diagnoses   Final diagnoses:  Acute non-recurrent sinusitis, unspecified location   Discharge Instructions   None    ED Prescriptions     Medication Sig Dispense Auth. Provider   predniSONE (DELTASONE) 10 MG tablet Take 1 tablet (10 mg total) by mouth daily with breakfast for 5 days. 5 tablet Bing Neighbors, FNP   azithromycin (ZITHROMAX) 250 MG tablet Take 2 tabs PO x 1 dose, then 1 tab PO QD x 4 days 6 tablet Bing Neighbors, FNP      PDMP not reviewed this encounter.   Bing Neighbors, [place] 06/01/21 [id number]

## 2021-06-01 NOTE — ED Triage Notes (Signed)
Pt here with sinus pressure/congestion and headache x 5 days. No fever. Pt is Runner, broadcasting/film/video.

## 2021-09-03 ENCOUNTER — Ambulatory Visit
Admission: EM | Admit: 2021-09-03 | Discharge: 2021-09-03 | Disposition: A | Discharge status: Home / Self Care | Payer: BC Managed Care – PPO

## 2021-09-03 ENCOUNTER — Other Ambulatory Visit: Payer: Self-pay

## 2021-09-03 ENCOUNTER — Encounter: Payer: Self-pay | Admitting: Emergency Medicine

## 2021-09-03 DIAGNOSIS — J329 Chronic sinusitis, unspecified: Secondary | ICD-10-CM | POA: Diagnosis not present

## 2021-09-03 LAB — POCT RAPID STREP A (OFFICE): Rapid Strep A Screen: NEGATIVE

## 2021-09-03 MED ORDER — LEVOCETIRIZINE DIHYDROCHLORIDE 5 MG PO TABS: 5.0000 mg | ORAL_TABLET | Freq: Every evening | ORAL | 0 refills | Status: AC

## 2021-09-03 MED ORDER — IPRATROPIUM BROMIDE 0.03 % NA SOLN: 2.0000 | Freq: Two times a day (BID) | NASAL | 0 refills | Status: AC

## 2021-09-03 MED ORDER — PREDNISONE 20 MG PO TABS: 20.0000 mg | ORAL_TABLET | Freq: Every day | ORAL | 0 refills | Status: AC

## 2021-09-03 NOTE — ED Provider Notes (Signed)
?[facility] ? ? ? ?CSN: [id number] ?Arrival date & time: 09/03/21  1620 ? ? ?  ? ?History   ?Chief Complaint ?Chief Complaint  ?Patient presents with  ? Nasal Congestion  ? Sinus Pressure  ? Cough  ? Sore Throat  ? ? ?HPI ?Casey Turner is a 31 y.o. female.  ? ?HPI ?Patient with a history of chronic allergic rhinitis presents today with cough, sore throat, persistent nasal congestion, and sinus pressure.  Symptoms have worsened over the last 3 days although have been intermittently present for the last several days.  Patient also feels that she is unable to hear appropriately out of her ears as they feel full.  She has been afebrile and denies any known sick contacts.  Denies any shortness of breath, chest tightness or weakness. ? ? ?Past Medical History:  ?Diagnosis Date  ? Allergic rhinitis   ? Chronic back pain   ? Dysmenorrhea   ? ? ?There are no problems to display for this patient. ? ? ?History reviewed. No pertinent surgical history. ? ?OB History   ?No obstetric history on file. ?  ? ? ? ?Home Medications   ? ?Prior to Admission medications   ?Medication Sig Start Date End Date Taking? Authorizing Provider  ?sertraline (ZOLOFT) 50 MG tablet TAKE 1 TABLET BY MOUTH EVERY DAY. TAKE 25MG  DAILY IN THE MORNING FOR 2 WEEKS AND INCREASE TO 50MG  DAILY 08/11/21  Yes [provider]  ?azithromycin (ZITHROMAX) 250 MG tablet Take 2 tabs PO x 1 dose, then 1 tab PO QD x 4 days 06/01/21   Scot Jun, FNP  ?benzonatate (TESSALON) 200 MG capsule Take 1 capsule (200 mg total) by mouth 3 (three) times daily as needed for cough. 05/14/18   Melynda Ripple, MD  ?chlorpheniramine-HYDROcodone [facility]) 10-8 MG/5ML SUER Take 5 mLs by mouth every 12 (twelve) hours as needed for cough. 05/14/18   Melynda Ripple, MD  ?famotidine (PEPCID) 20 MG tablet Take 1 tablet (20 mg total) by mouth 2 (two) times daily. 05/14/18   Melynda Ripple, MD  ?fluconazole (DIFLUCAN) 200 MG tablet Take 200 mg  by mouth once a week. 07/13/21   [provider]  ?fluticasone (FLONASE) 50 MCG/ACT nasal spray Place 2 sprays into both nostrils daily. 05/14/18   Melynda Ripple, MD  ?medroxyPROGESTERone (DEPO-PROVERA) 150 MG/ML injection Inject 150 mg into the muscle every 3 (three) months.    [provider]  ? ? ?Family History ?Family History  ?Problem Relation Age of Onset  ? Healthy Mother   ? Healthy Father   ? ? ?Social History ?Social History  ? ?Tobacco Use  ? Smoking status: Never  ? Smokeless tobacco: Never  ?Vaping Use  ? Vaping Use: Never used  ?Substance Use Topics  ? Alcohol use: Yes  ? Drug use: No  ? ? ? ?Allergies   ?Metronidazole ? ? ?Review of Systems ?Review of Systems ? ? ?Physical Exam ?Triage Vital Signs ?ED Triage Vitals [09/03/21 1849]  ?Enc Vitals Group  ?   BP 125/87  ?   Pulse Rate 77  ?   Resp 16  ?   Temp 99.3 ?F (37.4 ?C)  ?   Temp Source Oral  ?   SpO2 96 %  ?   Weight   ?   Height   ?   Head Circumference   ?   Peak Flow   ?   Pain Score   ?   Pain  Loc   ?   Pain Edu?   ?   Excl. in [place]?   ? ?No data found. ? ?Updated Vital Signs ?BP 125/87 (BP Location: Left Arm)   Pulse 77   Temp 99.3 ?F (37.4 ?C) (Oral)   Resp 16   SpO2 96%  ? ?Visual Acuity ?Right Eye Distance:   ?Left Eye Distance:   ?Bilateral Distance:   ? ?Right Eye Near:   ?Left Eye Near:    ?Bilateral Near:    ? ?Physical Exam ? ?General Appearance:    Alert, cooperative, no distress  ?HENT:   Normocephalic, ears normal, ears bilateral with middle ear effusions, nares mucosal edema with congestion, rhinorrhea, oropharynx without erythema or exudate  ?Eyes:    PERRL, conjunctiva/corneas clear, EOM's intact       ?Lungs:     Clear to auscultation bilaterally, respirations unlabored  ?Heart:    Regular rate and rhythm  ?Neurologic:   Awake, alert, oriented x 3. No apparent focal neurological           defect.   ?  ? ?UC Treatments / Results  ?Labs ?(all labs ordered are listed, but only abnormal results are  displayed) ?Labs Reviewed  ?POCT RAPID STREP A (OFFICE)  ? ? ?EKG ? ? ?Radiology ?No results found. ? ?Procedures ?Procedures (including critical care time) ? ?Medications Ordered in UC ?Medications - No data to display ? ?Initial Impression / Assessment and Plan / UC Course  ?I have reviewed the triage vital signs and the nursing notes. ? ?Pertinent labs & imaging results that were available during my care of the patient were reviewed by me and considered in my medical decision making (see chart for details). ? ?  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  Hydrate well with fluids.  Educated to take antihistamine daily to prevent recurrence of symptoms.  Follow-up with PCP as needed or return for evaluation if symptoms worsen or do not really improve. ?Final Clinical Impressions(s) / UC Diagnoses  ? ?Final diagnoses:  ?Recurrent rhinosinusitis  ? ?Discharge Instructions   ?None ?  ? ?ED Prescriptions   ? ? Medication Sig Dispense Auth. Provider  ? levocetirizine (XYZAL) 5 MG tablet Take 1 tablet (5 mg total) by mouth every evening. 30 tablet Scot Jun, FNP  ? predniSONE (DELTASONE) 20 MG tablet Take 1 tablet (20 mg total) by mouth daily with breakfast for 5 days. 5 tablet Scot Jun, FNP  ? ipratropium (ATROVENT) 0.03 % nasal spray Place 2 sprays into both nostrils 2 (two) times daily. 30 mL Scot Jun, FNP  ? ?  ? ?PDMP not reviewed this encounter. ?  ?Scot Jun, FNP ?09/12/21 [id number] ? ?

## 2021-09-03 NOTE — ED Triage Notes (Signed)
Pt c/o sinus pressure, cough, ST, runny nose, bilateral ears clogged x 3 days  ?

## 2021-12-11 ENCOUNTER — Ambulatory Visit
Admission: EM | Admit: 2021-12-11 | Discharge: 2021-12-11 | Disposition: A | Discharge status: Home / Self Care | Payer: BC Managed Care – PPO | Attending: Emergency Medicine | Admitting: Emergency Medicine

## 2021-12-11 DIAGNOSIS — H6692 Otitis media, unspecified, left ear: Secondary | ICD-10-CM

## 2021-12-11 MED ORDER — AMOXICILLIN 875 MG PO TABS: 875.0000 mg | ORAL_TABLET | Freq: Two times a day (BID) | ORAL | 0 refills | Status: AC

## 2021-12-11 NOTE — Discharge Instructions (Addendum)
Take the amoxicillin as directed.  Follow up with your primary care provider if your symptoms are not improving.   ° ° °

## 2021-12-11 NOTE — ED Triage Notes (Signed)
Patient presents to Urgent Care with complaints of left ear pain and swelling x 2 days. Not treating pain.

## 2021-12-11 NOTE — ED Provider Notes (Signed)
Casey Turner    CSN: [id number] Arrival date & time: 12/11/21  1605      History   Chief Complaint Chief Complaint  Patient presents with   Otalgia    HPI Casey Turner is a 31 y.o. female.  Patient presents with left ear pain x2 days.  Her throat hurts when she swallows.  No ear drainage, change in hearing, fever, chills, cough, shortness of breath, vomiting, diarrhea, or other symptoms.  No OTC medications taken.  Her medical history includes allergic rhinitis, dysmenorrhea, chronic back pain.  The history is provided by the patient and medical records.    Past Medical History:  Diagnosis Date   Allergic rhinitis    Chronic back pain    Dysmenorrhea     There are no problems to display for this patient.   History reviewed. No pertinent surgical history.  OB History   No obstetric history on file.      Home Medications    Prior to Admission medications   Medication Sig Start Date End Date Taking? Authorizing Provider  amoxicillin (AMOXIL) 875 MG tablet Take 1 tablet (875 mg total) by mouth 2 (two) times daily for 7 days. 12/11/21 12/18/21 Yes Mickie Bail, NP  benzonatate (TESSALON) 200 MG capsule Take 1 capsule (200 mg total) by mouth 3 (three) times daily as needed for cough. 05/14/18   Domenick Gong, MD  chlorpheniramine-HYDROcodone (TUSSIONEX PENNKINETIC ER) 10-8 MG/5ML SUER Take 5 mLs by mouth every 12 (twelve) hours as needed for cough. 05/14/18   Domenick Gong, MD  famotidine (PEPCID) 20 MG tablet Take 1 tablet (20 mg total) by mouth 2 (two) times daily. 05/14/18   Domenick Gong, MD  fluconazole (DIFLUCAN) 200 MG tablet Take 200 mg by mouth once a week. 07/13/21   [provider]  fluticasone (FLONASE) 50 MCG/ACT nasal spray Place 2 sprays into both nostrils daily. 05/14/18   Domenick Gong, MD  ipratropium (ATROVENT) 0.03 % nasal spray Place 2 sprays into both nostrils 2 (two) times daily. 09/03/21   Bing Neighbors, FNP   levocetirizine (XYZAL) 5 MG tablet Take 1 tablet (5 mg total) by mouth every evening. 09/03/21   Bing Neighbors, FNP  medroxyPROGESTERone (DEPO-PROVERA) 150 MG/ML injection Inject 150 mg into the muscle every 3 (three) months.    [provider]  sertraline (ZOLOFT) 50 MG tablet TAKE 1 TABLET BY MOUTH EVERY DAY. TAKE 25MG  DAILY IN THE MORNING FOR 2 WEEKS AND INCREASE TO 50MG  DAILY 08/11/21   [provider]    Family History Family History  Problem Relation Age of Onset   Healthy Mother    Healthy Father     Social History Social History   Tobacco Use   Smoking status: Never   Smokeless tobacco: Never  Vaping Use   Vaping Use: Never used  Substance Use Topics   Alcohol use: Yes   Drug use: No     Allergies   Metronidazole   Review of Systems Review of Systems  Constitutional:  Negative for chills and fever.  HENT:  Positive for ear pain and sore throat. Negative for ear discharge and hearing loss.   Respiratory:  Negative for cough and shortness of breath.   Gastrointestinal:  Negative for diarrhea and vomiting.  Skin:  Negative for color change and rash.  All other systems reviewed and are negative.    Physical Exam Triage Vital Signs ED Triage Vitals  Enc Vitals Group  BP      Pulse      Resp      Temp      Temp src      SpO2      Weight      Height      Head Circumference      Peak Flow      Pain Score      Pain Loc      Pain Edu?      Excl. in GC?    No data found.  Updated Vital Signs BP 117/76   Pulse 78   Temp 97.9 F (36.6 C)   Resp 18   SpO2 98%   Visual Acuity Right Eye Distance:   Left Eye Distance:   Bilateral Distance:    Right Eye Near:   Left Eye Near:    Bilateral Near:     Physical Exam Vitals and nursing note reviewed.  Constitutional:      General: She is not in acute distress.    Appearance: She is well-developed.  HENT:     Right Ear: Tympanic membrane and ear canal normal.     Left  Ear: Ear canal normal. Tympanic membrane is erythematous.     Nose: Nose normal.     Mouth/Throat:     Mouth: Mucous membranes are moist.     Pharynx: Oropharynx is clear.  Cardiovascular:     Rate and Rhythm: Normal rate and regular rhythm.     Heart sounds: Normal heart sounds.  Pulmonary:     Effort: Pulmonary effort is normal. No respiratory distress.     Breath sounds: Normal breath sounds.  Musculoskeletal:     Cervical back: Neck supple.  Skin:    General: Skin is warm and dry.  Neurological:     Mental Status: She is alert.  Psychiatric:        Mood and Affect: Mood normal.        Behavior: Behavior normal.      UC Treatments / Results  Labs (all labs ordered are listed, but only abnormal results are displayed) Labs Reviewed - No data to display  EKG   Radiology No results found.  Procedures Procedures (including critical care time)  Medications Ordered in UC Medications - No data to display  Initial Impression / Assessment and Plan / UC Course  I have reviewed the triage vital signs and the nursing notes.  Pertinent labs & imaging results that were available during my care of the patient were reviewed by me and considered in my medical decision making (see chart for details).  Left otitis media.  Treating with amoxicillin.  Discussed Tylenol or ibuprofen as needed.  Education provided on otitis media.  Instructed patient to follow-up with her PCP if her symptoms are not improving.  She agrees to plan of care.   Final Clinical Impressions(s) / UC Diagnoses   Final diagnoses:  Left otitis media, unspecified otitis media type     Discharge Instructions      Take the amoxicillin as directed.  Follow up with your primary care provider if your symptoms are not improving.        ED Prescriptions     Medication Sig Dispense Auth. Provider   amoxicillin (AMOXIL) 875 MG tablet Take 1 tablet (875 mg total) by mouth 2 (two) times daily for 7 days. 14  tablet Mickie Bail, NP      PDMP not reviewed this encounter.   Arlana Pouch,  Fredrich Romans, NP 12/11/21 1702
